# Patient Record
Sex: Female | Born: 2000
Health system: Southern US, Community
[De-identification: ages and names within clinical notes are randomized; demographics above are authoritative.]

## PROBLEM LIST (undated history)

## (undated) DIAGNOSIS — Z789 Other specified health status: Secondary | ICD-10-CM

## (undated) HISTORY — PX: NO PAST SURGERIES: SHX2092

---
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
## 2022-02-19 DIAGNOSIS — Z113 Encounter for screening for infections with a predominantly sexual mode of transmission: Secondary | ICD-10-CM | POA: Diagnosis not present

## 2022-04-03 ENCOUNTER — Other Ambulatory Visit (HOSPITAL_COMMUNITY): Payer: Self-pay

## 2022-04-03 MED ORDER — ONDANSETRON HCL 4 MG PO TABS
4.0000 mg | ORAL_TABLET | Freq: Three times a day (TID) | ORAL | 0 refills | Status: DC | PRN
  Filled 2022-04-03: qty 20, 7d supply, fill #0

## 2022-04-12 ENCOUNTER — Other Ambulatory Visit (HOSPITAL_COMMUNITY): Payer: Self-pay

## 2022-09-18 ENCOUNTER — Ambulatory Visit
Admission: RE | Admit: 2022-09-18 | Discharge: 2022-09-18 | Disposition: A | Discharge status: Home / Self Care | Payer: 59 | Source: Ambulatory Visit | Attending: Family Medicine | Admitting: Family Medicine

## 2022-09-18 ENCOUNTER — Other Ambulatory Visit (HOSPITAL_COMMUNITY): Payer: Self-pay

## 2022-09-18 VITALS — BP 125/89 | HR 85 | Temp 99.2°F | Resp 16 | Ht 65.5 in | Wt 120.0 lb

## 2022-09-18 DIAGNOSIS — L0231 Cutaneous abscess of buttock: Secondary | ICD-10-CM

## 2022-09-18 MED ORDER — MUPIROCIN 2 % EX OINT
1.0000 | TOPICAL_OINTMENT | Freq: Three times a day (TID) | CUTANEOUS | 0 refills | Status: AC
  Filled 2022-09-18: qty 22, 8d supply, fill #0

## 2022-09-18 MED ORDER — DOXYCYCLINE HYCLATE 100 MG PO CAPS
100.0000 mg | ORAL_CAPSULE | Freq: Two times a day (BID) | ORAL | 0 refills | Status: DC
  Filled 2022-09-18: qty 14, 7d supply, fill #0

## 2022-09-18 NOTE — ED Triage Notes (Signed)
Pt c/o abscess in R inner groin x3 days. States area tender. Denies any drainage or fevers.

## 2022-09-18 NOTE — Discharge Instructions (Addendum)
See handout regarding home care after your procedure today. Avoid shaving to prevent re-occurring abscesses. Stop by the pharmacy to pick up your prescriptions.  Follow up with your primary care provider as needed.

## 2022-09-18 NOTE — ED Provider Notes (Signed)
[facility]    CSN: [id number] Arrival date & time: 09/18/22  1003      History   Chief Complaint Chief Complaint  Patient presents with   Abscess    Appt    HPI TOVA KROGSTAD is a 22 y.o. female.   HPI  Bethan presents for buttock abscess that started 3 days ago.  She noticed a tender area in her right buttock.  It is gotten worse over the last couple of days.  There has not been any drainage or fever.  Denies similar symptoms before.  No fever.  It hurts for her to sit down.     Past Medical History:  Diagnosis Date   Medical history non-contributory     There are no problems to display for this patient.   Past Surgical History:  Procedure Laterality Date   NO PAST SURGERIES      OB History     Gravida  1   Para      Term      Preterm      AB      Living         SAB      IAB      Ectopic      Multiple      Live Births               Home Medications    Prior to Admission medications   Medication Sig Start Date End Date Taking? Authorizing Provider  doxycycline (VIBRAMYCIN) 100 MG capsule Take 1 capsule (100 mg total) by mouth 2 (two) times daily. 09/18/22  Yes Zilda No, DO  mupirocin ointment (BACTROBAN) 2 % Apply 1 Application topically 3 (three) times daily. 09/18/22  Yes Katha Cabal, DO    Family History Family History  Problem Relation Age of Onset   Hypercholesterolemia Mother    Hypertension Mother    Diabetes Maternal Grandmother    Stroke Maternal Grandmother    Healthy Father     Social History Social History   Tobacco Use   Smoking status: Never   Smokeless tobacco: Never  Vaping Use   Vaping status: Never Used  Substance Use Topics   Alcohol use: No   Drug use: No     Allergies   Morphine and codeine   Review of Systems Review of Systems :negative unless otherwise stated in HPI.      Physical Exam Triage Vital Signs ED Triage Vitals  Encounter Vitals Group     BP  09/18/22 1046 125/89     Systolic BP Percentile --      Diastolic BP Percentile --      Pulse Rate 09/18/22 1046 85     Resp 09/18/22 1046 16     Temp 09/18/22 1046 99.2 F (37.3 C)     Temp Source 09/18/22 1046 Oral     SpO2 09/18/22 1046 98 %     Weight 09/18/22 1046 120 lb (54.4 kg)     Height 09/18/22 1046 5' 5.5" (1.664 m)     Head Circumference --      Peak Flow --      Pain Score 09/18/22 1049 10     Pain Loc --      Pain Education --      Exclude from Growth Chart --    No data found.  Updated Vital Signs BP 125/89 (BP Location: Left Arm)   Pulse 85   Temp 99.2 F (  37.3 C) (Oral)   Resp 16   Ht 5' 5.5" (1.664 m)   Wt 54.4 kg   LMP 08/26/2022   SpO2 98%   BMI 19.67 kg/m   Visual Acuity Right Eye Distance:   Left Eye Distance:   Bilateral Distance:    Right Eye Near:   Left Eye Near:    Bilateral Near:     Physical Exam  GEN: alert, well appearing female, in no acute distress  RESP: no increased work of breathing, SKIN: warm and dry; right buttock near gluteal crease with 4 cm area of induration and edema with central pustule, no discharge and is very TTP    UC Treatments / Results  Labs (all labs ordered are listed, but only abnormal results are displayed) Labs Reviewed - No data to display  EKG   Radiology No results found.  Procedures Incision and Drainage  Date/Time: 09/18/2022 12:29 PM  Performed by: Katha Cabal, DO Authorized by: Katha Cabal, DO   Consent:    Consent obtained:  Verbal   Consent given by:  Patient   Risks, benefits, and alternatives were discussed: yes     Alternatives discussed:  Alternative treatment Universal protocol:    Patient identity confirmed:  Verbally with patient Location:    Type:  Abscess   Size:  4 cm   Location: right buttock. Pre-procedure details:    Skin preparation:  Chlorhexidine with alcohol Sedation:    Sedation type:  None Anesthesia:    Anesthesia method:  Local  infiltration   Local anesthetic:  Lidocaine 1% WITH epi Procedure type:    Complexity:  Simple Procedure details:    Incision types:  Single straight   Drainage:  Bloody and purulent   Drainage amount:  Copious   Wound treatment:  Wound left open   Packing materials:  None Post-procedure details:    Procedure completion:  Tolerated  (including critical care time)  Medications Ordered in UC Medications - No data to display  Initial Impression / Assessment and Plan / UC Course  I have reviewed the triage vital signs and the nursing notes.  Pertinent labs & imaging results that were available during my care of the patient were reviewed by me and considered in my medical decision making (see chart for details).     Patient is a 22 y.o. femalewho presents for painful bump on right buttocks.  Overall, patient is well-appearing and well-hydrated.  Vital signs stable.  Najah is afebrile.  Exam concerning for abscess.  Recommended I&D and patient is agreeable.  I&D performed and copious amount of purulent bloody fluid expressed.  Given doxycycline to ensure resolution.  Motrin and/or Tylenol as needed for pain.  Reviewed expectations regarding course of current medical issues.  All questions asked were answered.  Outlined signs and symptoms indicating need for more acute intervention. Patient verbalized understanding. After Visit Summary given.   Final Clinical Impressions(s) / UC Diagnoses   Final diagnoses:  Abscess of buttock, right     Discharge Instructions      See handout regarding home care after your procedure today. Avoid shaving to prevent re-occurring abscesses. Stop by the pharmacy to pick up your prescriptions.  Follow up with your primary care provider as needed.     ED Prescriptions     Medication Sig Dispense Auth. Provider   doxycycline (VIBRAMYCIN) 100 MG capsule Take 1 capsule (100 mg total) by mouth 2 (two) times daily. 14 capsule Jusiah Aguayo, Seward Meth, DO  mupirocin ointment (BACTROBAN) 2 % Apply 1 Application topically 3 (three) times daily. 22 g Katha Cabal, DO      PDMP not reviewed this encounter.              Katha Cabal, DO 09/18/22 1230

## 2022-09-24 ENCOUNTER — Ambulatory Visit (HOSPITAL_BASED_OUTPATIENT_CLINIC_OR_DEPARTMENT_OTHER): Payer: 59 | Admitting: Family Medicine

## 2022-11-06 ENCOUNTER — Ambulatory Visit
Admission: RE | Admit: 2022-11-06 | Discharge: 2022-11-06 | Disposition: A | Discharge status: Home / Self Care | Payer: 59 | Source: Ambulatory Visit | Attending: Pediatrics | Admitting: Pediatrics

## 2022-11-06 VITALS — BP 129/86 | HR 71 | Temp 98.3°F | Resp 18

## 2022-11-06 DIAGNOSIS — L739 Follicular disorder, unspecified: Secondary | ICD-10-CM

## 2022-11-06 NOTE — Discharge Instructions (Addendum)
Apply the mupirocin ointment as directed.  Follow up with your primary care provider.

## 2022-11-06 NOTE — ED Triage Notes (Signed)
Patient to Urgent Care with complaints of abscess present to her right buttock. Attempted to drain it- was able to express some pus.   Symptoms started 2 days ago. Hx of the same. Applied some mupirocin ointment from previous prescription.

## 2022-11-06 NOTE — ED Provider Notes (Signed)
[facility]    CSN: [id number] Arrival date & time: 11/06/22  2130      History   Chief Complaint Chief Complaint  Patient presents with   Blister    Cyst on buttocks - Entered by patient    HPI Frances Leon is a 22 y.o. female.  Patient presents with a bump on her right buttock x 2 days.  The bump occurred after she shaved the area.  She has been able to squeeze some pus from the area.  She has been using mupirocin ointment that was previously prescribed.  No fever, chills, difficulty with bowel movement, or other symptoms.  Patient was seen at [facility] urgent care on 09/18/2022; seen for abscess of right buttock; I&D performed; treated with doxycycline and mupirocin ointment.  The history is provided by the patient and medical records.    Past Medical History:  Diagnosis Date   Medical history non-contributory     There are no problems to display for this patient.   Past Surgical History:  Procedure Laterality Date   NO PAST SURGERIES      OB History     Gravida  1   Para      Term      Preterm      AB      Living         SAB      IAB      Ectopic      Multiple      Live Births               Home Medications    Prior to Admission medications   Medication Sig Start Date End Date Taking? Authorizing Provider  doxycycline (VIBRAMYCIN) 100 MG capsule Take 1 capsule (100 mg total) by mouth 2 (two) times daily. Patient not taking: Reported on 11/06/2022 09/18/22   Katha Cabal, DO  mupirocin ointment (BACTROBAN) 2 % Apply 1 Application topically 3 (three) times daily. 09/18/22   Katha Cabal, DO    Family History Family History  Problem Relation Age of Onset   Hypercholesterolemia Mother    Hypertension Mother    Diabetes Maternal Grandmother    Stroke Maternal Grandmother    Healthy Father     Social History Social History   Tobacco Use   Smoking status: Never   Smokeless tobacco: Never  Vaping Use   Vaping  status: Never Used  Substance Use Topics   Alcohol use: No   Drug use: No     Allergies   Morphine and codeine   Review of Systems Review of Systems  Constitutional:  Negative for chills and fever.  Gastrointestinal:  Negative for abdominal pain, blood in stool, constipation and diarrhea.  Skin:  Positive for wound. Negative for color change.     Physical Exam Triage Vital Signs ED Triage Vitals  Encounter Vitals Group     BP      Systolic BP Percentile      Diastolic BP Percentile      Pulse      Resp      Temp      Temp src      SpO2      Weight      Height      Head Circumference      Peak Flow      Pain Score      Pain Loc      Pain Education  Exclude from Growth Chart    No data found.  Updated Vital Signs BP 129/86   Pulse 71   Temp 98.3 F (36.8 C)   Resp 18   LMP 10/18/2022   SpO2 99%   Visual Acuity Right Eye Distance:   Left Eye Distance:   Bilateral Distance:    Right Eye Near:   Left Eye Near:    Bilateral Near:     Physical Exam Constitutional:      General: She is not in acute distress. HENT:     Mouth/Throat:     Mouth: Mucous membranes are moist.  Cardiovascular:     Rate and Rhythm: Normal rate and regular rhythm.  Pulmonary:     Effort: Pulmonary effort is normal. No respiratory distress.  Skin:    General: Skin is warm and dry.     Findings: Lesion present. No erythema.     Comments: Small flesh-colored papule on right buttock near perineum.  No open wound, drainage, induration, erythema.  Neurological:     General: No focal deficit present.     Mental Status: She is alert and oriented to person, place, and time.     Gait: Gait normal.  Psychiatric:        Mood and Affect: Mood normal.        Behavior: Behavior normal.      UC Treatments / Results  Labs (all labs ordered are listed, but only abnormal results are displayed) Labs Reviewed - No data to display  EKG   Radiology No results  found.  Procedures Procedures (including critical care time)  Medications Ordered in UC Medications - No data to display  Initial Impression / Assessment and Plan / UC Course  I have reviewed the triage vital signs and the nursing notes.  Pertinent labs & imaging results that were available during my care of the patient were reviewed by me and considered in my medical decision making (see chart for details).    Folliculitis.  No indication of abscess at this time.  Afebrile and vital signs are stable.  Instructed patient to continue using mupirocin ointment.  Education provided on folliculitis.  Instructed patient to follow-up with her PCP.  She agrees to plan of care.  Final Clinical Impressions(s) / UC Diagnoses   Final diagnoses:  Folliculitis     Discharge Instructions      Apply the mupirocin ointment as directed.  Follow up with your primary care provider.        ED Prescriptions   None    PDMP not reviewed this encounter.   Mickie Bail, NP 11/06/22 [phone]

## 2022-12-19 ENCOUNTER — Encounter (HOSPITAL_BASED_OUTPATIENT_CLINIC_OR_DEPARTMENT_OTHER): Payer: Self-pay | Admitting: Family Medicine

## 2023-02-26 DIAGNOSIS — Z01419 Encounter for gynecological examination (general) (routine) without abnormal findings: Secondary | ICD-10-CM | POA: Diagnosis not present

## 2023-02-26 DIAGNOSIS — Z113 Encounter for screening for infections with a predominantly sexual mode of transmission: Secondary | ICD-10-CM | POA: Diagnosis not present

## 2024-01-14 ENCOUNTER — Ambulatory Visit

## 2024-01-14 ENCOUNTER — Other Ambulatory Visit (HOSPITAL_COMMUNITY): Payer: Self-pay

## 2024-01-14 DIAGNOSIS — L729 Follicular cyst of the skin and subcutaneous tissue, unspecified: Secondary | ICD-10-CM | POA: Diagnosis not present

## 2024-01-14 MED ORDER — DOXYCYCLINE MONOHYDRATE 100 MG PO TABS
100.0000 mg | ORAL_TABLET | Freq: Two times a day (BID) | ORAL | 1 refills | Status: AC
  Filled 2024-01-14: qty 14, 7d supply, fill #0

## 2024-01-14 MED ORDER — CLINDAMYCIN PHOSPHATE 1 % EX SWAB
1.0000 | Freq: Two times a day (BID) | CUTANEOUS | 5 refills | Status: AC
  Filled 2024-01-14: qty 60, 30d supply, fill #0

## 2024-01-14 NOTE — Patient Instructions (Addendum)
 benzoyl peroxide wash 3-5% (brand name includes Neutragena Clear Pore, CereVe Acne Foaming Cream Cleanser, Differin Cleanser) that you use in the shower. Can bleach linens (clothes, towels, etc), will NOT bleach your skin or hair). Dry off with a white towel so it doesn't take the color out of clothing and colored towels.     Due to recent changes in healthcare laws, you may see results of your pathology and/or laboratory studies on MyChart before the doctors have had a chance to review them. We understand that in some cases there may be results that are confusing or concerning to you. Please understand that not all results are received at the same time and often the doctors may need to interpret multiple results in order to provide you with the best plan of care or course of treatment. Therefore, we ask that you please give us  2 business days to thoroughly review all your results before contacting the office for clarification. Should we see a critical lab result, you will be contacted sooner.   If You Need Anything After Your Visit  If you have any questions or concerns for your doctor, please call our main line at [phone] and press option 4 to reach your doctor's medical assistant. If no one answers, please leave a voicemail as directed and we will return your call as soon as possible. Messages left after 4 pm will be answered the following business day.   You may also send us  a message via MyChart. We typically respond to MyChart messages within 1-2 business days.  For prescription refills, please ask your pharmacy to contact our office. Our fax number is [phone].  If you have an urgent issue when the clinic is closed that cannot wait until the next business day, you can page your doctor at the number below.    Please note that while we do our best to be available for urgent issues outside of office hours, we are not available 24/7.   If you have an urgent issue and are unable to  reach us , you may choose to seek medical care at your doctor's office, retail clinic, urgent care center, or emergency room.  If you have a medical emergency, please immediately call 911 or go to the emergency department.  Pager Numbers  - Dr. Hester: [phone]  - Dr. Jackquline: [phone]  - Dr. Claudene: [phone]   - Dr. Raymund: [phone]  In the event of inclement weather, please call our main line at [phone] for an update on the status of any delays or closures.  Dermatology Medication Tips: Please keep the boxes that topical medications come in in order to help keep track of the instructions about where and how to use these. Pharmacies typically print the medication instructions only on the boxes and not directly on the medication tubes.   If your medication is too expensive, please contact our office at [phone] option 4 or send us  a message through MyChart.   We are unable to tell what your co-pay for medications will be in advance as this is different depending on your insurance coverage. However, we may be able to find a substitute medication at lower cost or fill out paperwork to get insurance to cover a needed medication.   If a prior authorization is required to get your medication covered by your insurance company, please allow us  1-2 business days to complete this process.  Drug prices often vary depending on where the prescription is filled and some pharmacies may  offer cheaper prices.  The website www.goodrx.com contains coupons for medications through different pharmacies. The prices here do not account for what the cost may be with help from insurance (it may be cheaper with your insurance), but the website can give you the price if you did not use any insurance.  - You can print the associated coupon and take it with your prescription to the pharmacy.  - You may also stop by our office during regular business hours and pick up a GoodRx coupon card.  -  If you need your prescription sent electronically to a different pharmacy, notify our office through [facility] or by phone at [phone] option 4.     Si Usted Necesita Algo Despus de Su Visita  Tambin puede enviarnos un mensaje a travs de Clinical cytogeneticist. Por lo general respondemos a los mensajes de MyChart en el transcurso de 1 a 2 das hbiles.  Para renovar recetas, por favor pida a su farmacia que se ponga en contacto con nuestra oficina. Randi lakes de fax es [place] [phone].  Si tiene un asunto urgente cuando la clnica est cerrada y que no puede esperar hasta el siguiente da hbil, puede llamar/localizar a su doctor(a) al nmero que aparece a continuacin.   Por favor, tenga en cuenta que aunque hacemos todo lo posible para estar disponibles para asuntos urgentes fuera del horario de [place], no estamos disponibles las 24 horas del da, los 7 [address]  [address] de la [place].   Si tiene un problema urgente y no puede comunicarse con nosotros, puede optar por buscar atencin mdica  en el consultorio de su doctor(a), en una clnica privada, en un centro de atencin urgente o en una sala de emergencias.  Si tiene Engineer, drilling, por favor llame inmediatamente al 911 o vaya a la sala de emergencias.  Nmeros de bper  - Dr. Hester: [phone]  - Dra. Jackquline: [phone]  - Dr. Claudene: [phone]  - Dra. Kitts: [phone]  En caso de inclemencias del [place], por favor llame a nuestra lnea principal al [phone] para una actualizacin sobre el estado de cualquier retraso o cierre.  Consejos para la medicacin en dermatologa: Por favor, guarde las cajas en las que vienen los medicamentos de uso tpico para ayudarle a seguir las instrucciones sobre dnde y cmo usarlos. Las farmacias generalmente imprimen las instrucciones del medicamento slo en las cajas y no directamente en los tubos del [place].   Si su medicamento es muy caro, por favor, pngase en  contacto con landry rieger llamando al [phone] y presione la opcin 4 o envenos un mensaje a travs de Clinical cytogeneticist.   No podemos decirle cul ser su copago por los medicamentos por adelantado ya que esto es diferente dependiendo de la cobertura de su seguro. Sin embargo, es posible que podamos encontrar un medicamento sustituto a Audiological scientist un formulario para que el seguro cubra el medicamento que se considera necesario.   Si se requiere una autorizacin previa para que su compaa de seguros malta su medicamento, por favor permtanos de 1 a 2 das hbiles para completar este proceso.  Los precios de los medicamentos varan con frecuencia dependiendo del Environmental consultant de dnde se surte la receta y alguna farmacias pueden ofrecer precios ms baratos.  El sitio web www.goodrx.com tiene cupones para medicamentos de Health and safety inspector. Los precios aqu no tienen en cuenta lo que podra costar con la ayuda del seguro (puede ser ms barato con su seguro), pero el sitio web puede darle  el precio si no Visual merchandiser.  - Puede imprimir el cupn correspondiente y llevarlo con su receta a la farmacia.  - Tambin puede pasar por nuestra oficina durante el horario de atencin regular y Education officer, museum una tarjeta de cupones de GoodRx.  - Si necesita que su receta se enve electrnicamente a una farmacia diferente, informe a nuestra oficina a travs de MyChart de [place] o por telfono llamando al [phone] y presione la opcin 4.

## 2024-01-14 NOTE — Progress Notes (Signed)
°  °  Subjective   Frances Leon is a 23 y.o. female who presents for the following: Lesion(s) of concern . Patient is new patient  Today patient reports: Cyst R buttocks, painful, not currently draining, flares with cycle hx of I&D at Urgent Care 11/06/22, Doxycycline  100mg  prn flares, gets bumps in groi  Review of Systems:    No other skin or systemic complaints except as noted in HPI or Assessment and Plan.  The following portions of the chart were reviewed this encounter and updated as appropriate: medications, allergies, medical history  Relevant Medical History:  n/a   Objective  (SKPE) Well appearing patient in no apparent distress; mood and affect are within normal limits. Examination was performed of the: Focused Exam of: buttocks, groin, axilla   Examination notable for: tender plaque R perineum  Examination limited by: Undergarments     Assessment & Plan  (SKAP)   Cyst R perineum  - Benign, patient reassured - no recurrent lesions to suggest HS  - Given that the lesion is symptomatic, patient would like to proceed with excision. Recommended discussing with gyn given location   - Continue Doxycycline  100mg  1 po bid for 1 more week, take with food and drink   - advised can't take 100 mg bid x 14 days for flares   - Start BP wash in the shower to clean area  - Start Clindamycin  wipes qd/bid aa   Doxycycline  should be taken with food to prevent nausea. Do not lay down for 30 minutes after taking. Be cautious with sun exposure and use good sun protection while on this medication. Pregnant women should not take this medication.   Was sun protection counseling provided?: No   Level of service outlined above   Patient instructions (SKPI)   Procedures, orders, diagnosis for this visit:    There are no diagnoses linked to this encounter.  Return to clinic: Return if symptoms worsen or fail to improve.  I, Grayce Saunas, RMA, am acting as scribe for Lauraine JAYSON Kanaris, MD  .   Documentation: I have reviewed the above documentation for accuracy and completeness, and I agree with the above.  Lauraine JAYSON Kanaris, MD
